# Patient Record
Sex: Female | Born: 1950 | Race: White | Hispanic: No | Smoking: Never smoker
Health system: Southern US, Community
[De-identification: ages and names within clinical notes are randomized; demographics above are authoritative.]

## PROBLEM LIST (undated history)

## (undated) HISTORY — PX: TUBAL LIGATION: SHX77

---

## 2007-02-22 ENCOUNTER — Emergency Department (HOSPITAL_COMMUNITY): Admission: EM | Admit: 2007-02-22 | Discharge: 2007-02-22 | Payer: Self-pay | Admitting: Family Medicine

## 2008-01-21 IMAGING — CR DG FOOT COMPLETE 3+V*R*
3 series · 3 of 3 positions shown · non-contrast
Comparison: None.

CLINICAL DATA: Fell 9 days ago, persistent lateral foot pain and swelling.

LEFT FOOT - 3 VIEW  02/22/2007:

[view not recorded (1 of 3)]
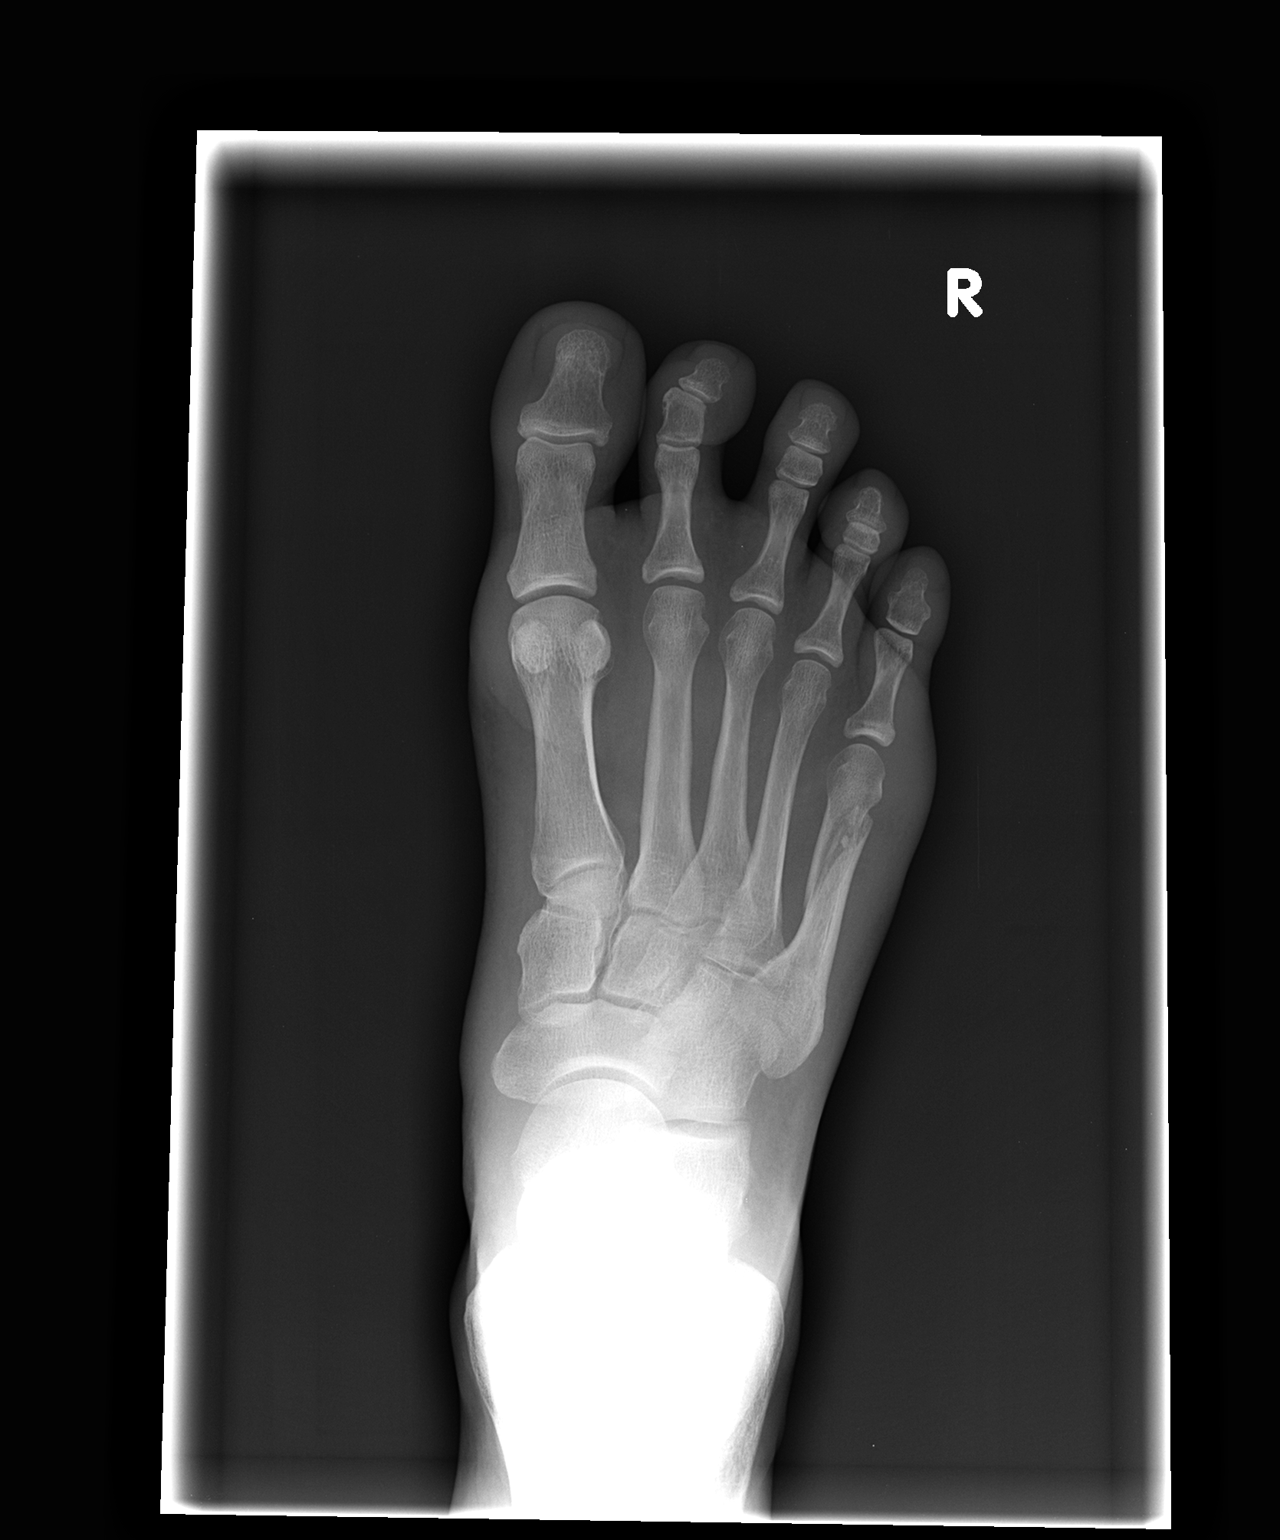

[view not recorded (2 of 3)]
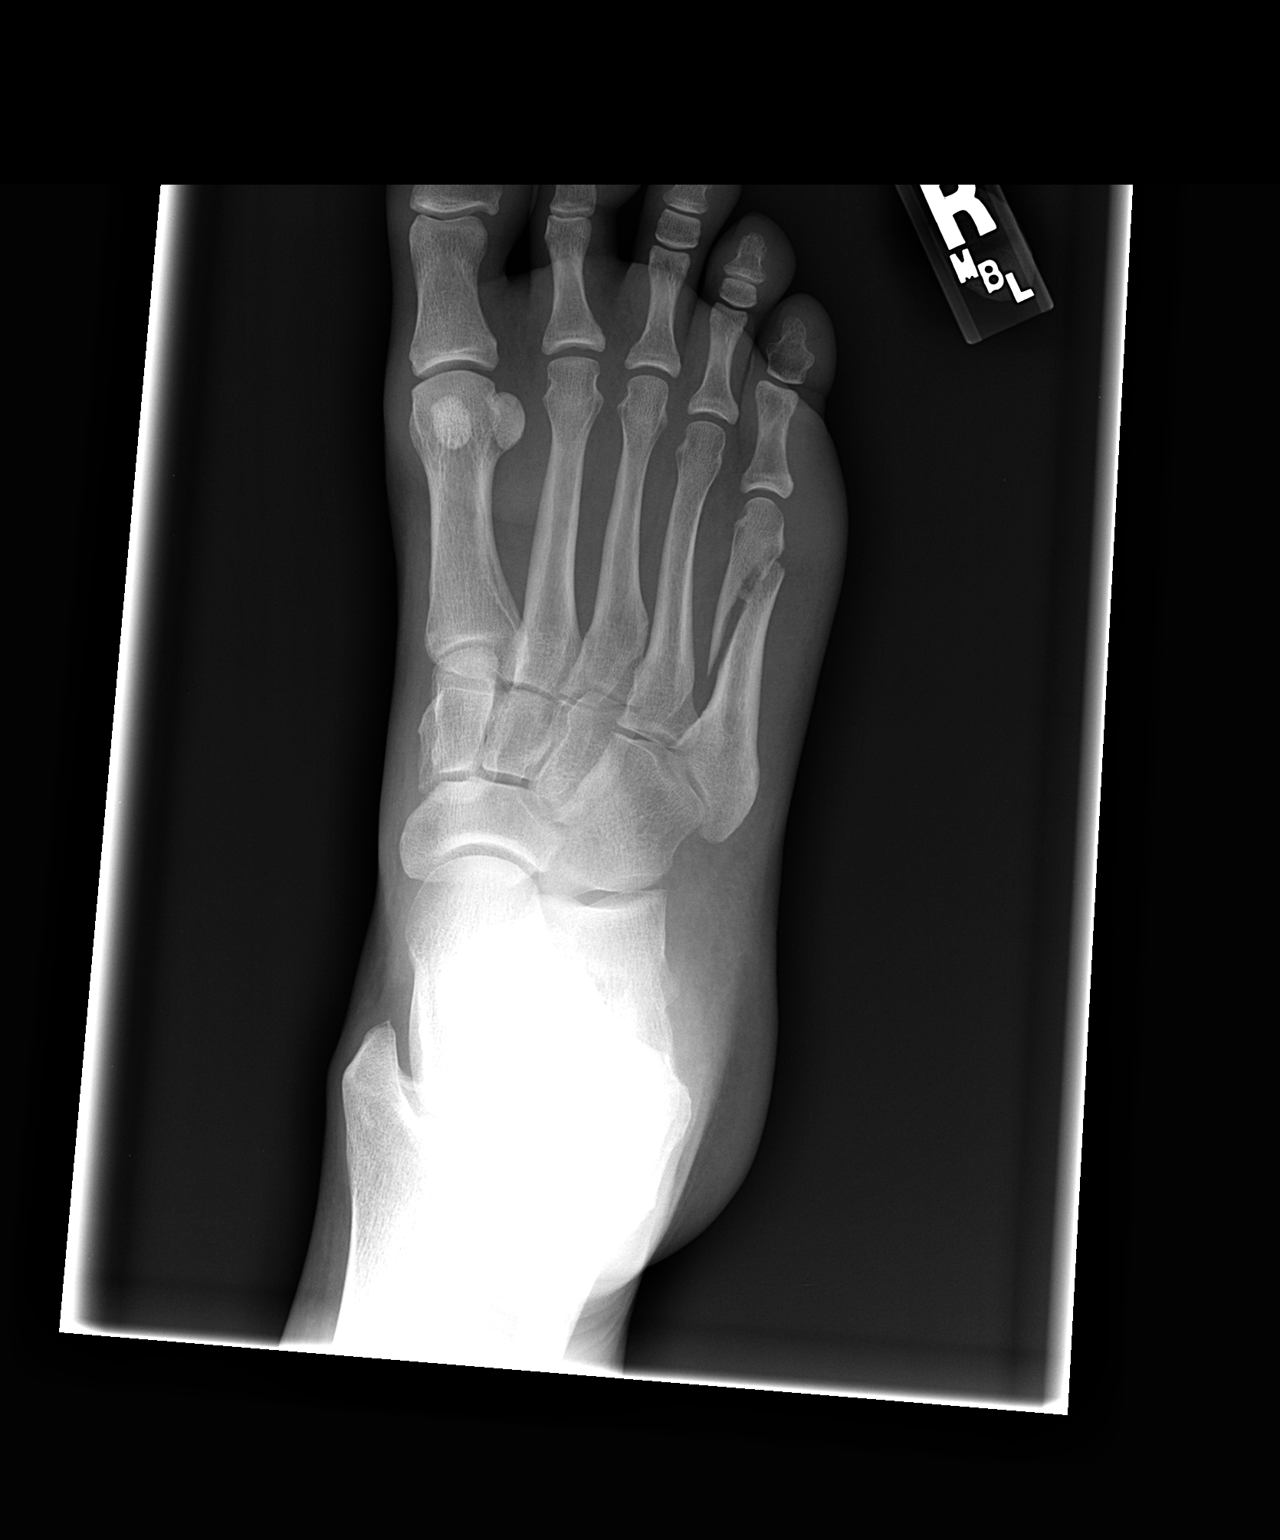

[view not recorded (3 of 3)]
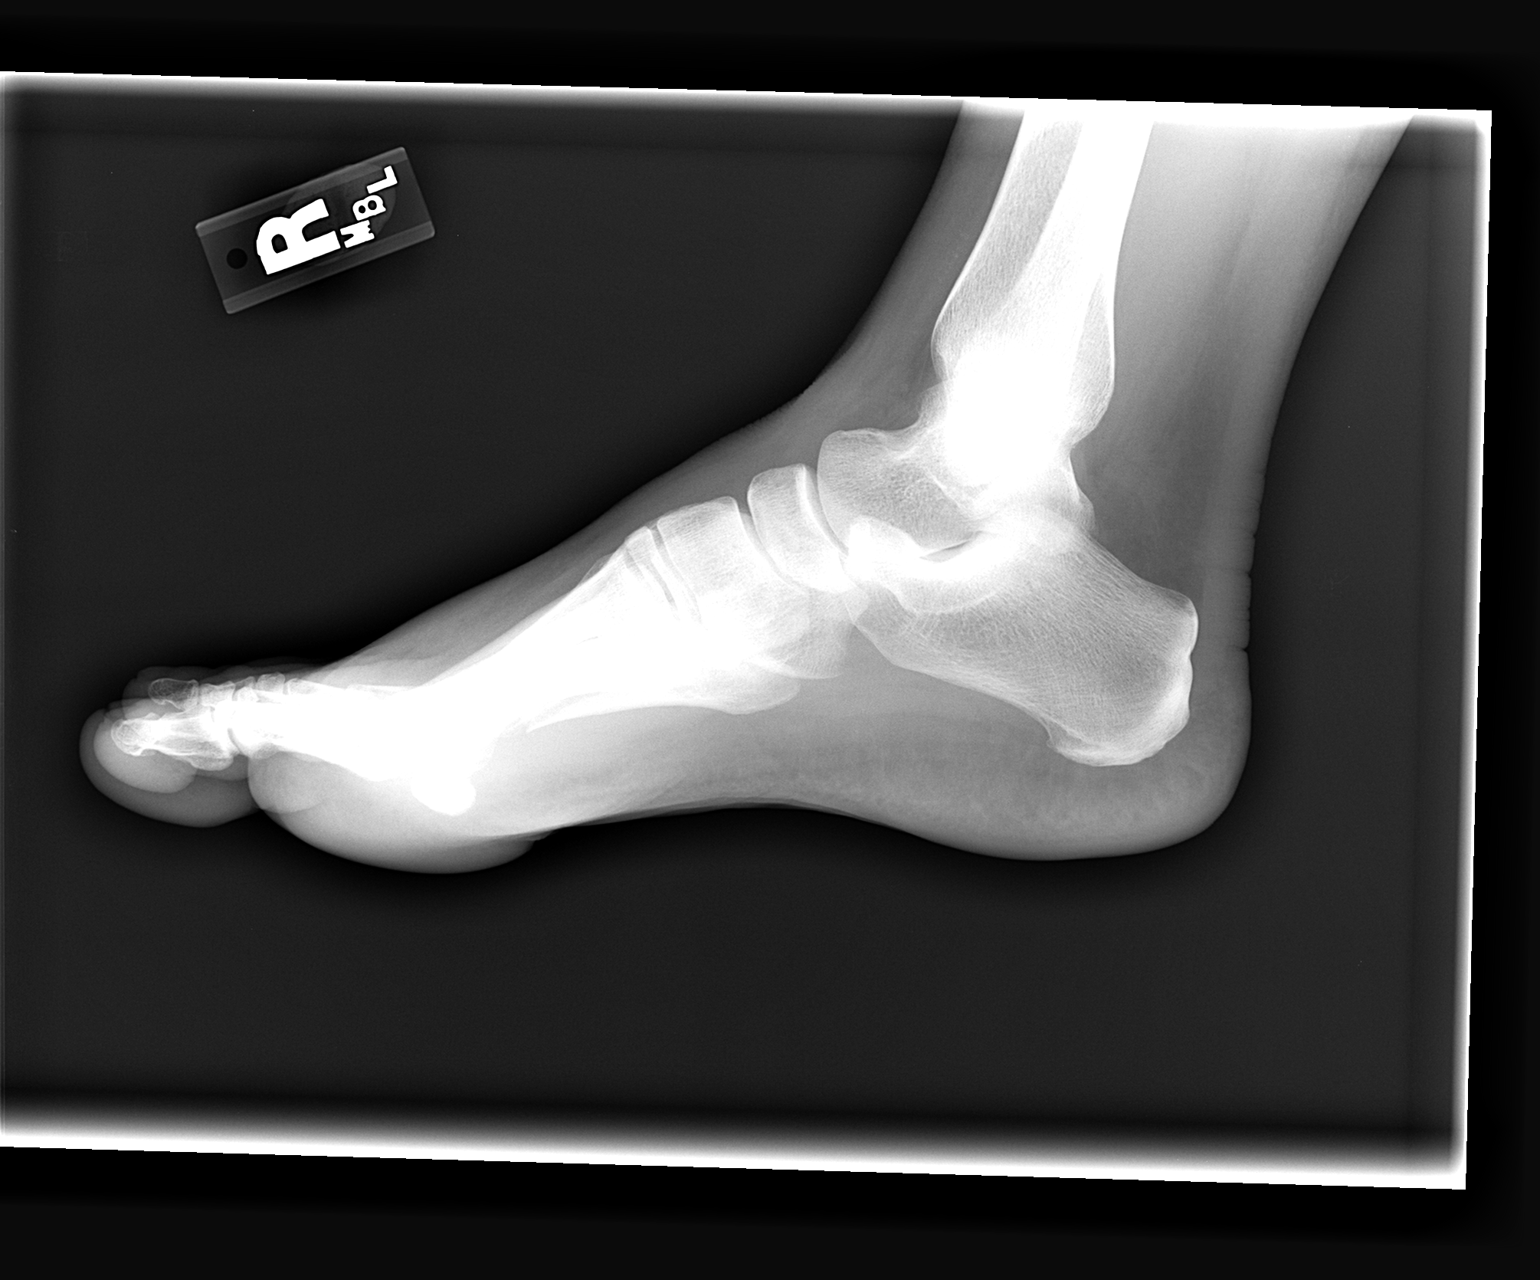

[3 of 3 positions shown; findings below may reference images not displayed]

FINDINGS: Obliquely oriented comminuted fracture through the shaft of the fifth
metatarsal. No other fractures. Joint spaces well-preserved throughout. No other
intrinsic osseous abnormalities.
IMPRESSION: Oblique fracture through the shaft of the fifth metatarsal.

## 2022-02-19 ENCOUNTER — Telehealth: Payer: Self-pay | Admitting: Family Medicine

## 2022-02-19 NOTE — Telephone Encounter (Signed)
Pt is a new pt on Thursday. She would like a call to talk about having a MWV be the code for this appt so she doesn't have to pay for this visit or the labs she wants. Her husband passed away and she is a one income. Please advise.

## 2022-02-20 NOTE — Telephone Encounter (Signed)
Spoke to patient to advised that provider will not do a NP/est care appointment and MWV together.  She wasn't happy with my answer and I also explained that we have RN do the AWV not the provider.  Her reasons are that medicare will pay for the blood work and appointment with no cost to her.      Is there any better way to advise her of this? Please advise.  Thank. Dm/cma

## 2022-02-22 ENCOUNTER — Ambulatory Visit (INDEPENDENT_AMBULATORY_CARE_PROVIDER_SITE_OTHER): Payer: Medicare Other | Admitting: Family Medicine

## 2022-02-22 ENCOUNTER — Encounter: Payer: Self-pay | Admitting: Family Medicine

## 2022-02-22 VITALS — BP 134/64 | HR 82 | Temp 97.9°F | Ht 62.0 in | Wt 106.6 lb

## 2022-02-22 DIAGNOSIS — R030 Elevated blood-pressure reading, without diagnosis of hypertension: Secondary | ICD-10-CM | POA: Diagnosis not present

## 2022-02-22 DIAGNOSIS — Q632 Ectopic kidney: Secondary | ICD-10-CM | POA: Diagnosis not present

## 2022-02-22 DIAGNOSIS — Q519 Congenital malformation of uterus and cervix, unspecified: Secondary | ICD-10-CM

## 2022-02-22 LAB — BASIC METABOLIC PANEL
BUN: 17 mg/dL (ref 6–23)
CO2: 31 mEq/L (ref 19–32)
Calcium: 9.2 mg/dL (ref 8.4–10.5)
Chloride: 105 mEq/L (ref 96–112)
Creatinine, Ser: 0.58 mg/dL (ref 0.40–1.20)
GFR: 91.38 mL/min (ref >60.00)
Glucose, Bld: 93 mg/dL (ref 70–99)
Potassium: 3.8 mEq/L (ref 3.5–5.1)
Sodium: 138 mEq/L (ref 135–145)

## 2022-02-22 NOTE — Progress Notes (Signed)
[facility] [address] [place] [address] Dept: [phone] Dept Fax: [phone]  New Patient Office Visit  Subjective:    Patient ID: Karen Donovan, female    DOB: 26-Jun-1951, 71 y.o..   MRN: [id number]  Chief Complaint  Patient presents with   Establish Care    NP- establish care.  No concerns. Fasting today.     History of Present Illness:  Patient is in today to establish care. Ms. Karen Donovan was born in [place], [place]. She attended the [employer] of [place] and has multiple Associates degrees in various aspects of [employer]	[place]. She worked for [employer] in [employer], among other insurance related jobs. She was married for 49 years before her husband died about a year ago due to drug-induced kidney failure. She admits to ongoing grief, but feels her strong [employer] faith is helping her deal with this. Ms. Karen Donovan has two children: a daughter (83) who lives in [place], and a son (70) living in [place]. She has 5 grandchildren. She is living in a Event organiser in [place]. she denies any tobacco, alcohol,. or drug use.  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     No facility-administered medications prior to visit.   No Known Allergies    Objective:   Today's Vitals   02/22/22 0841  BP: 134/64  Pulse: 82  Temp: 97.9 F (36.6 C)  TempSrc: Temporal  SpO2: 98%  Weight: 106 lb 9.6  oz (48.4 kg)  Height: 5\' 2"  (1.575 m)   Body mass index is 19.5 kg/m.   General: Well developed, well nourished. No acute distress. CV: RRR without murmurs or rubs. Pulses 2+ bilaterally. Psych: Alert and oriented. Normal mood and affect.  Health Maintenance Due  Topic Date Due   Hepatitis C Screening  Never done   TETANUS/TDAP  Never done   COLONOSCOPY (Pts 45-29yrs Insurance coverage will need to be confirmed)  Never done   MAMMOGRAM  Never done   DEXA SCAN  Never done   COVID-19 Vaccine (3 - Moderna series) 04/28/2020     Assessment & Plan:   1. Elevated blood-pressure reading without diagnosis of hypertension Ms. Karen Donovan's blood pressure is in the stage 1 hypertension range today. I recommend she monitor this at home. If her systolic persists in the 130s, she should follow up with me. I will check a BMP to assess renal function and electrolytes, esp. in light of her history of an ectopic kidney.  - Basic metabolic panel  Ms. Karen Donovan was very concerned about the cost of her care and her visit today not being done in the context of a Medicare AWV. She is also concerned about the cost of lab work, which she has been having done annually through her physician in 04/30/2020. She notes a lack of trust of nurse practitioners, fear of antibiotics, and worries over her own renal function in  light of her husband's tragic death. She refuses to consider having a mammogram, due to a prior bad experience with resulting pain from a mammogram some years ago. She is fearful of the potential for ruptured bowel during colonoscopy, so declines colon cancer screening. She also does not like the thoguht of x-ray exposure, so declines bone density scanning.  Return in about 1 year (around 02/23/2023) for Reassessment.   Loyola Mast, MD

## 2022-03-08 NOTE — Telephone Encounter (Signed)
Noted. Dm/cma  

## 2022-08-30 ENCOUNTER — Ambulatory Visit (INDEPENDENT_AMBULATORY_CARE_PROVIDER_SITE_OTHER): Payer: Medicare Other | Admitting: Family Medicine

## 2022-08-30 VITALS — BP 135/77 | HR 68 | Temp 98.3°F | Ht 62.0 in | Wt 112.3 lb

## 2022-08-30 DIAGNOSIS — N3001 Acute cystitis with hematuria: Secondary | ICD-10-CM

## 2022-08-30 DIAGNOSIS — R319 Hematuria, unspecified: Secondary | ICD-10-CM | POA: Diagnosis not present

## 2022-08-30 LAB — POCT URINALYSIS DIPSTICK
Bilirubin, UA: NEGATIVE
Glucose, UA: NEGATIVE
Ketones, UA: NEGATIVE
Nitrite, UA: NEGATIVE
Protein, UA: NEGATIVE
Spec Grav, UA: <=1.005 — AB (ref 1.010–1.025)
Urobilinogen, UA: 0.2 E.U./dL
pH, UA: 7.5 (ref 5.0–8.0)

## 2022-08-30 MED ORDER — NITROFURANTOIN MONOHYD MACRO 100 MG PO CAPS: 100.0000 mg | ORAL_CAPSULE | Freq: Two times a day (BID) | ORAL | 0 refills | Status: DC

## 2022-08-30 NOTE — Progress Notes (Signed)
OFFICE VISIT  08/30/2022  CC:  Chief Complaint  Patient presents with   Blood in urine    Symptoms started 2 days ago , describes as tingling in genital area. She did not notice blood in urine until today. She used old rx for Macrodantin 100mg  capsule prn    Patient is a 71 y.o. female who presents for bright red blood in urine.  HPI: 2 days ago patient started having a "tickle" in the urethral area, primarily when she urinates.  She does not feel any particular increase in urgency or frequency.  No burning with urination. This morning she had an episode of gross hematuria.  She denies abdominal pain, flank pain, nausea, or fever. 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 She saw a urologist at 1 point and was prescribed Macrobid to take prn intercourse. She has not taken this in several years but she had some old ones around and took 1 dose yesterday.  History reviewed. No pertinent past medical history.  Past Surgical History:  Procedure Laterality Date   CESAREAN SECTION     1979 & 1981   TUBAL LIGATION     1981    Outpatient Medications Prior to Visit  Medication Sig Dispense Refill   Multiple Vitamin (MULTIVITAMIN) tablet Take 1 tablet by mouth daily.     No facility-administered medications prior to visit.    No Known Allergies  Review of Systems  As per HPI  PE:    08/30/2022   10:25 AM 02/22/2022    8:41 AM  Vitals with BMI  Height 5\' 2"  5\' 2"   Weight 112 lbs 5 oz 106 lbs 10 oz  BMI 20.53 19.49  Systolic 135 134  Diastolic 77 64  Pulse 68 82     Physical Exam  Gen: Alert, well appearing.  Patient is oriented to person, place, time, and situation. AFFECT: Very anxious, lucid thought and speech. No further exam today.  LABS:   Last metabolic panel Lab Results  Component Value Date   GLUCOSE 93 02/22/2022   NA 138 02/22/2022   K 3.8 02/22/2022   CL 105 02/22/2022   CO2 31 02/22/2022   BUN  17 02/22/2022   CREATININE 0.58 02/22/2022   CALCIUM 9.2 02/22/2022   IMPRESSION AND PLAN:  Acute cystitis with hematuria. She brought in a urine sample from home this morning and it appears grossly bloody, UA showing 3+ blood and 3+ leukocytes. A fresh urine specimen in the office today is clear and yellow and dipstick UA shows 3+ blood and 1+ leukocytes. Her urine will be sent for culture and sensitivity. She is very afraid of antibiotics because apparently her husband died earlier this year from kidney failure.  He had been treated with several antibiotics for urinary infection.  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   She has confidence that she will do okay with Macrobid so I did prescribe Macrobid 100 mg to take twice daily x 5 days. Encouraged her to follow-up with her primary care physician.  An After Visit Summary was printed and given to the patient.  FOLLOW UP: Return if symptoms worsen or fail to improve.  Signed:  02/24/2022, MD           08/30/2022

## 2022-08-31 LAB — URINE CULTURE
MICRO NUMBER:: 14365583
Result:: NO GROWTH
SPECIMEN QUALITY:: ADEQUATE

## 2022-09-06 ENCOUNTER — Telehealth: Payer: Self-pay | Admitting: Family Medicine

## 2022-09-06 DIAGNOSIS — R310 Gross hematuria: Secondary | ICD-10-CM

## 2022-09-06 DIAGNOSIS — R3915 Urgency of urination: Secondary | ICD-10-CM

## 2022-09-06 MED ORDER — NITROFURANTOIN MONOHYD MACRO 100 MG PO CAPS: 100.0000 mg | ORAL_CAPSULE | Freq: Two times a day (BID) | ORAL | 0 refills | Status: DC

## 2022-09-06 NOTE — Telephone Encounter (Signed)
Pt has called back a couple different times. She continues to call about being referred to a urologist, and now has a name of a preferred Dr. Unless Dr. Anitra Lauth knows someone in the [place] area. She was referred to the name Dr. Raynelle Bring located on [address].

## 2022-09-06 NOTE — Telephone Encounter (Signed)
Pt saw Dr Anitra Lauth on 08/30/22 for Acute cystitis with hematuria +1 more Dx. In the AVS he says to call back if blood in her urine continues and he will place a referral. It has happened again, she has blood In her urine, burning and urgency. She is wanting to come by and get a urine container from our office. Please advise pt at [phone].

## 2022-09-06 NOTE — Telephone Encounter (Signed)
Pt is wanting to know Dr Rudd's thought process in him giving her a specimen cup. I let her know several times she requested the cup. Pt would like a cb.

## 2022-09-06 NOTE — Telephone Encounter (Signed)
Okay, Macrobid refill sent in. Urology referral completed.

## 2022-09-06 NOTE — Telephone Encounter (Signed)
She took her last one, 01/01 or 01/02. She did not have any symptoms during time of taking medication. Current symptoms: burning, tingling, urgency to go as returned, hematuria was present at 6am. She drank plenty of water and blood was not present this last time went.

## 2022-09-06 NOTE — Telephone Encounter (Signed)
Spoke to patient and advised that Dr Lum Babe office called in a prescription and placed a referral .   She already picked up the urine cup.  Dm/cma

## 2022-09-06 NOTE — Telephone Encounter (Signed)
Pt is very concerned and ask that Dr. Anitra Lauth refill the [facility] because that helped with her symptoms and made them go away. She also request that she get referred to a Urologist not an NP and she would like one that has years of experience and is older. Please give the patient a call.

## 2022-09-06 NOTE — Telephone Encounter (Signed)
Referral pending, if approved please sign

## 2022-09-06 NOTE — Telephone Encounter (Signed)
Pt advised referral sent and refill of medication

## 2022-09-06 NOTE — Telephone Encounter (Signed)
Pt would like you to know she has fasted in case you need to test her creatine.

## 2022-12-24 ENCOUNTER — Encounter: Payer: Self-pay | Admitting: Nurse Practitioner

## 2022-12-24 ENCOUNTER — Ambulatory Visit (INDEPENDENT_AMBULATORY_CARE_PROVIDER_SITE_OTHER): Payer: Medicare Other | Admitting: Nurse Practitioner

## 2022-12-24 VITALS — BP 121/80 | HR 64 | Temp 98.4°F | Resp 16 | Ht 62.0 in | Wt 110.0 lb

## 2022-12-24 DIAGNOSIS — S134XXA Sprain of ligaments of cervical spine, initial encounter: Secondary | ICD-10-CM

## 2022-12-24 MED ORDER — IBUPROFEN 200 MG PO TABS: 400.0000 mg | ORAL_TABLET | Freq: Three times a day (TID) | ORAL | 0 refills | Status: DC | PRN

## 2022-12-24 NOTE — Patient Instructions (Signed)
Use ibuprofen 200-400mg  every 8hrs as needed for pain, take with food Use warm compress 3x/day, each time before neck exercise Then use cold compress after neck exercise. Perform neck exercise once a day.  Cervical Sprain A cervical sprain is a stretch or tear in one or more of the ligaments in the neck. Ligaments are the tissues that connect bones to each other. Cervical sprains can range from mild to severe. Severe cervical sprains can cause the spinal bones (vertebrae) in the neck to be unstable. This can result in spinal cord damage and serious nervous system problems. Healing time for a cervical sprain depends on the cause and extent of the injury. Most cervical sprains heal in 4-6 weeks. What are the causes? Cervical sprains may be caused by trauma, such as an injury from a motor vehicle accident, a fall, or a sudden forward and backward whipping movement of the head and neck (whiplash injury). Mild cervical sprains may be caused by wear and tear over time. What increases the risk? You are more likely to get a cervical sprain if: You take part in activities that have a high risk of trauma to the neck. These include contact sports, gymnastics, and diving. You have: Osteoarthritis of the spine. Poor strength and flexibility of the neck. Poor posture. You have had a neck injury in the past. You spend long periods in positions that put stress on the neck, such as sitting at a computer. What are the signs or symptoms? Symptoms of this condition include: Any of these problems in the neck, shoulders, or upper back: Pain or tenderness. Stiffness. Swelling. A burning feeling. Sudden tightening of neck muscles (spasms). Limited ability to move the neck. Headache. Dizziness. Nausea or vomiting. Weakness, numbness, or tingling in a hand or an arm. Symptoms may develop right away after injury or may develop over a few days. In some cases, symptoms may go away with treatment and return  (recur) over time. How is this diagnosed? This condition may be diagnosed based on: Your symptoms, medical history, and a physical exam. Any recent injuries or known neck problems that you have, such as arthritis in the neck. Imaging tests, such as X-rays, an MRI, or a CT scan. How is this treated? This condition is treated by resting and icing the injured area and doing physical therapy exercises to improve movement and strength. Heat therapy may be used 2-3 days after the injury if there is no swelling. Depending on the severity of your condition, treatment may also include: Keeping your neck in place (immobilized) for periods of time. This may be done using: A cervical collar. This supports your chin and the back of your head. A cervical traction device. This is a sling that holds up your head. It removes weight and pressure from your neck. Medicines for pain or other symptoms. Surgery. This is rare. Follow these instructions at home: Medicines Take over-the-counter and prescription medicines only as told by your health care provider. Ask your provider if the medicine prescribed to you: Requires you to avoid driving or using machinery. Can cause constipation. You may need to take these actions to prevent or treat constipation: Drink enough fluid to keep your pee pale yellow. Take over-the-counter or prescription medicines. Eat foods that are high in fiber, such as beans, whole grains, and fresh fruits and vegetables. Limit foods that are high in fat and processed sugars, such as fried or sweet foods. If you have a cervical collar: Wear the collar as  told by your provider. Do not remove it unless told. Ask before making any adjustments to your collar. If you have long hair, keep it outside of the collar. If you are allowed to remove the collar for cleaning and bathing: Follow instructions about how to remove it safely. Clean it by hand with mild soap and water and air-dry it  completely. If your collar has removable pads, remove them every 1-2 days and wash them by hand with soap and water. Let them air-dry completely before putting them back in the collar. Tell your provider if your skin under the collar has irritation or sores. Managing pain, stiffness, and swelling     Use a cervical traction device as told. If told, put ice on the affected area. Put ice in a plastic bag. Place a towel between your skin and the bag. Leave the ice on for 20 minutes, 2-3 times a day. If told, apply heat to the affected area before you exercise or as often as told by your provider. Use the heat source that your provider recommends, such as a moist heat pack or a heating pad. Place a towel between your skin and the heat source. Leave the heat on for 20-30 minutes. If your skin turns bright red, remove the ice or heat right away to prevent skin damage. The risk of damage is higher if you cannot feel pain, heat, or cold. Activity Do not drive while wearing a cervical collar. If you do not have a cervical collar, ask if it is safe to drive while your neck heals. Do not lift anything that is heavier than 10 lb (4.5 kg) until your provider says that it is safe. Rest as told by your provider. Avoid positions and activities that make your symptoms worse. Do physical therapy exercises as told by your provider or physical therapist. Return to your normal activities as told by your provider. Ask your provider what activities are safe for you. General instructions Do not use any products that contain nicotine or tobacco. These products include cigarettes, chewing tobacco, and vaping devices, such as e-cigarettes. These can delay healing. If you need help quitting, ask your provider. Keep all follow-up visits. Your provider will monitor your injury and activity level. How is this prevented? To prevent a cervical sprain from happening again: Use and maintain good posture. Make any needed  adjustments to your workstation to help you do this. Exercise regularly as told by your provider or physical therapist. Avoid risky activities that may cause a cervical sprain. Contact a health care provider if: You have symptoms that get worse or do not get better after 2 weeks of treatment. You have new symptoms. Your pain gets worse or does not get better with medicine. You have sores or irritated skin on your neck from wearing your cervical collar. Get help right away if: You have severe pain. You develop numbness, tingling, or weakness in any part of your body. You cannot move a part of your body (you have paralysis). You have neck pain along with severe dizziness or headache. This information is not intended to replace advice given to you by your health care provider. Make sure you discuss any questions you have with your health care provider. Document Revised: 03/23/2022 Document Reviewed: 03/23/2022 Elsevier Patient Education  2023 [employer]  Cervical Strain and Sprain Rehab Ask your health care provider which exercises are safe for you. Do exercises exactly as told by your health care provider and adjust them as  directed. It is normal to feel mild stretching, pulling, tightness, or discomfort as you do these exercises. Stop right away if you feel sudden pain or your pain gets worse. Do not begin these exercises until told by your health care provider. Stretching and range-of-motion exercises Cervical side bending  Using good posture, sit on a stable chair or stand up. Without moving your shoulders, slowly tilt your left / right ear to your shoulder until you feel a stretch in the neck muscles on the opposite side. You should be looking straight ahead. Hold for __________ seconds. Repeat with the other side of your neck. Repeat __________ times. Complete this exercise __________ times a day. Cervical rotation  Using good posture, sit on a stable chair or stand up. Slowly  turn your head to the side as if you are looking over your left / right shoulder. Keep your eyes level with the ground. Stop when you feel a stretch along the side and the back of your neck. Hold for __________ seconds. Repeat this by turning to your other side. Repeat __________ times. Complete this exercise __________ times a day. Thoracic extension and pectoral stretch  Roll a towel or a small blanket so it is about 4 inches (10 cm) in diameter. Lie down on your back on a firm surface. Put the towel in the middle of your back across your spine. It should not be under your shoulder blades. Put your hands behind your head and let your elbows fall out to your sides. Hold for __________ seconds. Repeat __________ times. Complete this exercise __________ times a day. Strengthening exercises Upper cervical flexion  Lie on your back with a thin pillow behind your head or a small, rolled-up towel under your neck. Gently tuck your chin toward your chest and nod your head down to look toward your feet. Do not lift your head off the pillow. Hold for __________ seconds. Release the tension slowly. Relax your neck muscles completely before you repeat this exercise. Repeat __________ times. Complete this exercise __________ times a day. Cervical extension  Stand about 6 inches (15 cm) away from a wall, with your back facing the wall. Place a soft object, about 6-8 inches (15-20 cm) in diameter, between the back of your head and the wall. A soft object could be a small pillow, a ball, or a folded towel. Gently tilt your head back and press into the soft object. Keep your jaw and forehead relaxed. Hold for __________ seconds. Release the tension slowly. Relax your neck muscles completely before you repeat this exercise. Repeat __________ times. Complete this exercise __________ times a day. Posture and body mechanics Body mechanics refer to the movements and positions of your body while you do your  daily activities. Posture is part of body mechanics. Good posture and healthy body mechanics can help to relieve stress in your body's tissues and joints. Good posture means that your spine is in its natural S-curve position (your spine is neutral), your shoulders are pulled back slightly, and your head is not tipped forward. The following are general guidelines for using improved posture and body mechanics in your everyday activities. Sitting  When sitting, keep your spine neutral and keep your feet flat on the floor. Use a footrest, if needed, and keep your thighs parallel to the floor. Avoid rounding your shoulders. Avoid tilting your head forward. When working at a desk or a computer, keep your desk at a height where your hands are slightly lower than your  elbows. Slide your chair under your desk so you are close enough to maintain good posture. When working at a computer, place your monitor at a height where you are looking straight ahead and you do not have to tilt your head forward or downward to look at the screen. Standing  When standing, keep your spine neutral and keep your feet about hip-width apart. Keep a slight bend in your knees. Your ears, shoulders, and hips should line up. When you do a task in which you stand in one place for a long time, place one foot up on a stable object that is 2-4 inches (5-10 cm) high, such as a footstool. This helps keep your spine neutral. Resting When lying down and resting, avoid positions that are most painful for you. Try to support your neck in a neutral position. You can use a contour pillow or a small rolled-up towel. Your pillow should support your neck but not push on it. This information is not intended to replace advice given to you by your health care provider. Make sure you discuss any questions you have with your health care provider. Document Revised: 03/12/2022 Document Reviewed: 03/12/2022 Elsevier Patient Education  2023 [employer].
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
## 2023-04-22 ENCOUNTER — Encounter: Payer: Medicare Other | Admitting: Family Medicine

## 2023-11-26 ENCOUNTER — Encounter: Payer: Self-pay | Admitting: Internal Medicine

## 2023-11-26 ENCOUNTER — Ambulatory Visit: Payer: Self-pay | Admitting: *Deleted

## 2023-11-26 ENCOUNTER — Ambulatory Visit (INDEPENDENT_AMBULATORY_CARE_PROVIDER_SITE_OTHER): Admitting: Internal Medicine

## 2023-11-26 VITALS — BP 130/68 | HR 87 | Temp 98.9°F | Ht 62.0 in | Wt 113.8 lb

## 2023-11-26 DIAGNOSIS — T7840XA Allergy, unspecified, initial encounter: Secondary | ICD-10-CM | POA: Diagnosis not present

## 2023-11-26 MED ORDER — METHYLPREDNISOLONE 4 MG PO TBPK: ORAL_TABLET | ORAL | 0 refills | Status: DC

## 2023-11-26 MED ORDER — METHYLPREDNISOLONE SODIUM SUCC 125 MG IJ SOLR
125.0000 mg | Freq: Once | INTRAMUSCULAR | Status: AC
  Administered 2023-11-26: 125 mg via INTRAMUSCULAR

## 2023-11-26 MED ORDER — FAMOTIDINE 20 MG PO TABS: 20.0000 mg | ORAL_TABLET | Freq: Two times a day (BID) | ORAL | 0 refills | Status: DC

## 2023-11-26 NOTE — Telephone Encounter (Addendum)
 Copied from CRM [id number]. Topic: Clinical - Red Word Triage >> Nov 26, 2023  1:26 PM Elizebeth Brooking wrote: [facility] that prompted transfer to Nurse Triage: Patient called in stating she is having a allergic reaction to fresh fruit, stated she woke up last night choking because her tongue was so swollen, stated she wants to see Dr.Rudd tomorrow at o'clock pm , did not schedule until they spoke with nurses first Reason for Disposition  SEVERE swelling of the entire face    Allergic reaction to oranges she ate on Sunday morning.  Facial swelling, tongue swelling.   She woke up choking during the night due to the swelling in her tongue.  Answer Assessment - Initial Assessment Questions 1. SYMPTOMS: "What is the most serious symptom?"     I ate oranges on Sunday morning.   Having an allergic reaction.   I know better but I was in a hurry.  I just needed to eat something real quick.   My mouth was breaking out in sores and hurting on Sunday night.  Woke up Monday the right side of my face was swollen and hurting and my chin was swollen too.     Monday night I had cold compresses on it due to the pain.   I took a Tylenol which I never do because I don't believe in pain killers.    Last night I woke up choking due to my tongue being so swollen.   It's swollen now.   I took another Benadryl just now.   I just want to come in for that 1:00 apt tomorrow with Dr. Veto Kemps.   "The lady I just talked to said he has an appt tomorrow at 1:00 but she said I had to talk to the nurse first".   "I just need you to get that appt before it's gone".   "That 1:00 appt tomorrow".    "I really need to get back to my gardening just please make that appt for tomorrow".    Pt was insistant on me making the appt.   I let her know she needed to go to the ED or at least be seen today due to the swelling in her tongue and the reaction she was having.    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   I'm ok.    I can still  breath.  I just need to come in tomorrow at that 1:00 appt.  2. AIRWAY: "Are they breathing?" (e.g., Yes, No)  (R/O: airway blockage, respiratory arrest)      She woke up last night choking because her tongue is so swollen.   She said the swelling is better today and she's not having difficulty breathing.    3. BREATHING: "Is there difficulty breathing?" (e.g., Yes, No; wheezing, unable to complete a sentence)  (R/O: respiratory distress)     No 4. CIRCULATION: "Are you feeling weak?"  (e.g., Yes, No, Unknown; severity)  (R/O: shock) If Yes, ask: "Can you stand and walk normally?"     No    I feel fine.   "I just want that 1:00 appt tomorrow with Dr. Veto Kemps before it gets gone".   "The lady I was talking to before you (agent) said he had that open. 5. SWALLOWING: "Can you swallow?" (e.g.,Yes, No; food, fluid, saliva)      Yes  I'm doing fine.   The swelling is much better today.  I knew better than to eat the fruit. 6. ONSET: "When did the reaction start?" (Minutes or hours ago)      Sunday morning after eating oranges 7. SUBSTANCE: "What are you reacting to?" "When did the contact occur?"      Oranges 8. PREVIOUS REACTION: "Have you ever reacted to it before?" If Yes, ask: "What happened that time?"     Yes  I swelled up like this.   I just waited it out.   I was fine.   I'm having the same thing happen this time.   I knew better than to eat the fruit but I was in a hurry and needed to eat something. 9. EPINEPHRINE: "Do you have an epinephrine autoinjector (e.g., EpiPen)?"     No  Protocols used: Anaphylaxis-A-AH, Face Swelling-A-AH  Chief Complaint: Having an allergic reaction to some oranges she ate on Sunday morning. Symptoms: Sunday night her mouth started breaking out in sores and hurting.  She had swelling of the right side of her chin and face along with pain.  Last night she woke up choking because her tongue was so swollen.  She took some Benadryl and "I'm doing much better now".    Frequency: Started Sunday night Pertinent Negatives: Patient denies shortness of breath, or chest tightness or difficulty breathing.   Denies wheezing. Disposition: [] ED /[] Urgent Care (no appt availability in office) / [x] Appointment(In office/virtual)/ []  [place] [facility]/ [] Home Care/ [] Refused Recommended Disposition /[] [facility] [facility]/ []  Follow-up with PCP Additional Notes: See triage notes for details.   When agent transferred her to me for triage she kept insisting she needed that 1:00 appt tomorrow with Dr. Veto Kemps.   It was difficult to triage her and get her to answer questions as she kept insisting she wanted that 1:00 appt with Dr. Veto Kemps tomorrow before someone else got it.   I let her know at first she needed to go to the ED however after talking further with her and getting more answers to the triage questions it was determined she didn't need to go to the ED at this point but still needed to be seen today preferably.  She is taking Benadryl and she stated the swelling is much better now.   Her speech was clear and appropriate and she denied any breathing difficulties but her tongue was not as swollen.    She was very insistent on getting that 1:00 appt.   She finally got short with me and said,   "I just need to get back out to my gardening just make me that 1:00 appt with Dr. Veto Kemps for tomorrow".    I made her the appt but I did go over the signs/symptoms she needed to know to go to the ED:  shortness of breath, increased swelling of her tongue, lips and throat and her face, wheezing, chest tightness or breaking out in hives.    She responded,   "I am fine I don't need to go to the emergency room or anything".    "Did you make that appt for 1:00 tomorrow so I can get back to my gardening?"  I let her know I did make the appt.   She thanked me and hung up.     I called into the practice and spoke with Myrene Buddy.   She is going to get this information to Dr. Veto Kemps and let him make the  decision as to whether she should go on to the ED since  she became short with me during the triage.

## 2023-11-26 NOTE — Telephone Encounter (Signed)
 Spoke to patient and she declines to go to the Urgent care.  I was able to get her in today with Morrie Sheldon at 3:40 pm.    Patient has been worked up.  Dm/cma

## 2023-11-26 NOTE — Patient Instructions (Signed)
 Continue benadryl 25mg  ever 4-6 hours as needed for swelling.  Take medications as prescribed.   Need to schedule a routine check up with Dr. Veto Kemps in the next couple months for health maintenance.

## 2023-11-26 NOTE — Telephone Encounter (Signed)
 Angelique Blonder noticed this pt was scheduled for tomorrow at 1:00. Pending symptoms, she alerted me. Attempted to call pt to advise her to go to [facility] or ED pending symptoms. Pt scheduled at 3:40 this afternoon.

## 2023-11-26 NOTE — Progress Notes (Signed)
 [facility] [address] [place] [address] Dept: [phone] Dept Fax: [phone]  Acute Care Office Visit  Subjective:   Karen Donovan 07-31-1951 11/26/2023  Chief Complaint  Patient presents with   Allergic Reaction    C/o having facial swelling/tongue x 2 days.  Has been taking Benadryl.   (Fresh fruit)    HPI: Discussed the use of AI scribe software for clinical note transcription with the patient, who gave verbal consent to proceed.  History of Present Illness   The patient presents with an allergic reaction, which she attributes to consuming two bowls of mixed fruit, primarily oranges 2 days ago. The reaction began with facial swelling and mouth pain within an hour of consumption. Despite attempts at home remedies, the swelling persisted and worsened, leading to significant tongue swelling and a choking incident while laying in bed. The patient has a history of similar reactions to fresh fruit, which were previously managed with prednisone. She has been self-medicating with Benadryl every four hours, which has provided minimal relief and caused fatigue. Denies SHOB, wheezing.         The following portions of the patient's history were reviewed and updated as appropriate: past medical history, past surgical history, family history, social history, allergies, medications, and problem list.   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 No pertinent past medical history. Past Surgical History:  Procedure Laterality Date   CESAREAN SECTION     1979 & 1981   TUBAL LIGATION     1981   Family History  Problem Relation Age of Onset   Heart disease Mother    Emphysema Father     Current Outpatient Medications:    Biotin 10 MG CAPS, Take by mouth., Disp: , Rfl:    famotidine (PEPCID) 20  MG tablet, Take 1 tablet (20 mg total) by mouth 2 (two) times daily for 10 days., Disp: 20 tablet, Rfl: 0   methylPREDNISolone (MEDROL DOSEPAK) 4 MG TBPK tablet, Use as directed., Disp: 21 each, Rfl: 0   Multiple Vitamin (MULTIVITAMIN) tablet, Take 1 tablet by mouth daily., Disp: , Rfl:  No Known Allergies   ROS: A complete ROS was performed with pertinent positives/negatives noted in the HPI. The remainder of the ROS are negative.    Objective:   Today's Vitals   11/26/23 1543  BP: 130/68  Pulse: 87  Temp: 98.9 F (37.2 C)  TempSrc: Temporal  SpO2: 99%  Weight: 113 lb 12.8 oz (51.6 kg)  Height: 5\' 2"  (1.575 m)    GENERAL: Well-appearing, in NAD. Well nourished. Able to speak in complete sentences.  SKIN: Pink, warm and dry. No rash.  HEENT:    HEAD: Normocephalic, non-traumatic.  EYES: Conjunctive pink without exudate. PERRL.  EARS: External ear w/o redness, swelling, masses, or lesions. EAC clear. TM's intact, translucent w/o bulging, appropriate landmarks visualized.  NOSE: Septum midline w/o deformity. Nares patent, mucosa pink and non-inflamed w/o drainage.  THROAT: Uvula midline. Oropharynx clear. Tonsils non-inflamed w/o exudate . Mucus membranes pink and moist. Tongue with mild diffuse swelling. Mild-moderate facial swelling to chin and cheeks.  NECK: Trachea midline. Full ROM w/o pain or tenderness. No lymphadenopathy.  RESPIRATORY: Chest wall symmetrical. Respirations even and non-labored. Breath sounds clear to auscultation bilaterally. No stridor or wheezing.  CARDIAC: S1, S2 present, regular rate and rhythm. Peripheral pulses  2+ bilaterally.  EXTREMITIES: Without clubbing, cyanosis, or edema.  NEUROLOGIC: Steady, even gait.  PSYCH/MENTAL STATUS: Alert, oriented x 3. Cooperative, appropriate mood and affect.    No results found for any visits on 11/26/23.    Assessment & Plan:  Assessment and Plan    Allergic Reaction Acute allergic reaction likely due to fresh  fruit, specifically oranges, causing significant tongue swelling and difficulty swallowing.  - Administer Solumedrol 125mg  IM today. - Prescribe oral steroid pack to start tomorrow. - Continue Benadryl as needed. - Prescribe Pepcid for histamine response. - Advise avoidance of fresh fruits, especially oranges. - Recommend emergency care if airway difficulty or worsening symptoms occur. - Consider future allergy testing.  General Health Maintenance She has not had a routine visit since 2023. Encouraged to maintain annual check-ups. - Schedule routine follow-up visit in the next couple of months. - Encourage annual check-ups.      Meds ordered this encounter  Medications   methylPREDNISolone sodium succinate (SOLU-MEDROL) 125 mg/2 mL injection 125 mg   methylPREDNISolone (MEDROL DOSEPAK) 4 MG TBPK tablet    Sig: Use as directed.    Dispense:  21 each    Refill:  0    Supervising Provider:   Garnette Gunner [[id number]]   famotidine (PEPCID) 20 MG tablet    Sig: Take 1 tablet (20 mg total) by mouth 2 (two) times daily for 10 days.    Dispense:  20 tablet    Refill:  0    Supervising Provider:   Garnette Gunner [[id number]]   No orders of the defined types were placed in this encounter.  Lab Orders  No laboratory test(s) ordered today   No images are attached to the encounter or orders placed in the encounter.  Return if symptoms worsen or fail to improve.   Salvatore Decent, FNP

## 2023-11-27 ENCOUNTER — Ambulatory Visit: Admitting: Family Medicine

## 2023-12-02 ENCOUNTER — Telehealth: Payer: Self-pay | Admitting: Family Medicine

## 2023-12-02 ENCOUNTER — Ambulatory Visit (INDEPENDENT_AMBULATORY_CARE_PROVIDER_SITE_OTHER): Admitting: Internal Medicine

## 2023-12-02 ENCOUNTER — Encounter: Payer: Self-pay | Admitting: Internal Medicine

## 2023-12-02 VITALS — BP 120/74 | HR 73 | Temp 97.7°F | Ht 62.0 in | Wt 110.2 lb

## 2023-12-02 DIAGNOSIS — K120 Recurrent oral aphthae: Secondary | ICD-10-CM

## 2023-12-02 DIAGNOSIS — K05219 Aggressive periodontitis, localized, unspecified severity: Secondary | ICD-10-CM | POA: Diagnosis not present

## 2023-12-02 MED ORDER — AMOXICILLIN-POT CLAVULANATE 875-125 MG PO TABS: 1.0000 | ORAL_TABLET | Freq: Two times a day (BID) | ORAL | 0 refills | Status: AC

## 2023-12-02 NOTE — Progress Notes (Signed)
 [facility] [address] [place] [address] Dept: [phone] Dept Fax: [phone]  Acute Care Office Visit  Subjective:   Karen Donovan 02-Jan-1951 12/02/2023  Chief Complaint  Patient presents with   Follow-up    HPI: Discussed the use of AI scribe software for clinical note transcription with the patient, who gave verbal consent to proceed.  History of Present Illness   The patient, with a recent history of an allergic reaction, presents with oral discomfort and swelling onset last week. She reports that prednisone has been effective in reducing the tongue and facial swelling for the recent allergic reaction. She attempted to use an electric toothbrush and gum massager this past week, which initially felt good but subsequently irritated her lower gum line and caused swelling. The swelling was not painful but formed a large "lump" in her left lower gumline. After drinking lots of water, the "lump" burst, releasing pus and a small amount of blood. Since this event, the patient reports feeling much better. However, she has noticed a new lump in the chin area, which she believes is related to the initial swelling. She also reports a swollen tongue, which she believes looks much better than before. The patient is on her last day of prednisone and has four more days of Pepcid. She has been avoiding fresh fruit due to the allergic reaction.       The following portions of the patient's history were reviewed and updated as appropriate: past medical history, past surgical history, family history, social history, allergies, medications, and problem list.   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 No pertinent past medical history. Past Surgical History:  Procedure Laterality Date   CESAREAN SECTION      1979 & 1981   TUBAL LIGATION     1981   Family History  Problem Relation Age of Onset   Heart disease Mother    Emphysema Father     Current Outpatient Medications:    amoxicillin-clavulanate (AUGMENTIN) 875-125 MG tablet, Take 1 tablet by mouth 2 (two) times daily for 7 days., Disp: 14 tablet, Rfl: 0   Biotin 10 MG CAPS, Take by mouth., Disp: , Rfl:    famotidine (PEPCID) 20 MG tablet, Take 1 tablet (20 mg total) by mouth 2 (two) times daily for 10 days., Disp: 20 tablet, Rfl: 0   methylPREDNISolone (MEDROL DOSEPAK) 4 MG TBPK tablet, Use as directed., Disp: 21 each, Rfl: 0   Multiple Vitamin (MULTIVITAMIN) tablet, Take 1 tablet by mouth daily., Disp: , Rfl:  No Known Allergies   ROS: A complete ROS was performed with pertinent positives/negatives noted in the HPI. The remainder of the ROS are negative.    Objective:   Today's Vitals   12/02/23 1020  BP: 120/74  Pulse: 73  Temp: 97.7 F (36.5 C)  TempSrc: Temporal  SpO2: 99%  Weight: 110 lb 3.2 oz (50 kg)  Height: 5\' 2"  (1.575 m)    GENERAL: Well-appearing, in NAD. Well nourished.  SKIN: Pink, warm and dry. No rash, lesion, ulceration, or ecchymoses.  HEENT:    HEAD: Normocephalic, non-traumatic.  THROAT: Uvula midline. Oropharynx clear. Mucus membranes pink and moist.  < 5cm Ulceration with white discharge present to right lower gum line. No tongue swelling.  RESPIRATORY: Chest wall symmetrical. Respirations even and non-labored.  PSYCH/MENTAL STATUS: Alert, oriented  x 3. Cooperative, appropriate mood and affect.    No results found for any visits on 12/02/23.    Assessment & Plan:  Assessment and Plan    Canker sore with secondary gum infection - Prescribe Augmentin (amoxicillin/clavulanate) 1 tablet twice a day for 7 days - Recommend over-the-counter Orajel for analgesia if needed       Meds ordered this encounter  Medications   amoxicillin-clavulanate (AUGMENTIN) 875-125 MG tablet    Sig: Take 1 tablet by  mouth 2 (two) times daily for 7 days.    Dispense:  14 tablet    Refill:  0    Supervising Provider:   Garnette Gunner [[id number]]   No orders of the defined types were placed in this encounter.  Lab Orders  No laboratory test(s) ordered today   No images are attached to the encounter or orders placed in the encounter.  Return if symptoms worsen or fail to improve.   Salvatore Decent, FNP

## 2023-12-02 NOTE — Telephone Encounter (Signed)
 Pt was sent a text to call for NT. She has an appt at 10:20 and feels she is ok it is not an allergic reaction.

## 2023-12-18 ENCOUNTER — Other Ambulatory Visit: Payer: Self-pay

## 2023-12-18 ENCOUNTER — Ambulatory Visit
Admission: EM | Admit: 2023-12-18 | Discharge: 2023-12-18 | Disposition: A | Discharge status: Home / Self Care | Attending: Internal Medicine | Admitting: Internal Medicine

## 2023-12-18 ENCOUNTER — Ambulatory Visit: Payer: Self-pay

## 2023-12-18 DIAGNOSIS — T781XXA Other adverse food reactions, not elsewhere classified, initial encounter: Secondary | ICD-10-CM

## 2023-12-18 DIAGNOSIS — F411 Generalized anxiety disorder: Secondary | ICD-10-CM

## 2023-12-18 MED ORDER — FAMOTIDINE 20 MG PO TABS: 20.0000 mg | ORAL_TABLET | Freq: Two times a day (BID) | ORAL | 0 refills | Status: DC

## 2023-12-18 MED ORDER — DEXAMETHASONE SODIUM PHOSPHATE 10 MG/ML IJ SOLN
10.0000 mg | Freq: Once | INTRAMUSCULAR | Status: AC
  Administered 2023-12-18: 10 mg via INTRAMUSCULAR

## 2023-12-18 MED ORDER — LORATADINE 10 MG PO TABS: 10.0000 mg | ORAL_TABLET | Freq: Every day | ORAL | 0 refills | Status: DC

## 2023-12-18 NOTE — Telephone Encounter (Signed)
  Chief Complaint: allergic reaction Symptoms: tongue swelling, mouth blisters Frequency: x this afternoon Pertinent Negatives: Patient denies SOB, weakness. Disposition: [x] ED /[] Urgent Care (no appt availability in office) / [] Appointment(In office/virtual)/ []  [facility]/ [] Home Care/ [x] Refused Recommended Disposition /[] [facility]/ []  Follow-up with PCP Additional Notes: Patient very anxious in hysterics, speaking very rapidly and crying, not allowing RN to speak. She states she has blisters in her mouth and tongue swelling. She states she realized she ate kiwi earlier and has had an allergic reaction to it in the past. Patient refused ED. Called [facility] to advise of ED refusal and asked if they have any availability. They do not. Patient states: "I'm reading on the internet you should contact you doctor immediately". Then states she is just going to show up with out an appointment. Advised patient to not to do that and go to ED. Patient is requesting an Epi Pen as well.  Patient states she has taken a Benadryl.  . Reason for Disposition  [1] Widespread hives, itching or facial swelling AND [2] onset < 2 hours of exposure to high-risk allergen (e.g., sting, nuts, 1st dose of antibiotic)  Answer Assessment - Initial Assessment Questions 1. SYMPTOMS: "What is the most serious symptom?"     Tongue swelling and blisters in mouth.  2. AIRWAY: "Are they breathing?" (e.g., Yes, No)  (R/O: airway blockage, respiratory arrest)      Yes.  3. BREATHING: "Is there difficulty breathing?" (e.g., Yes, No; wheezing, unable to complete a sentence)  (R/O: respiratory distress)     No. Able to speak in full sentences.  4. CIRCULATION: "Are you feeling weak?"  (e.g., Yes, No, Unknown; severity)  (R/O: shock) If Yes, ask: "Can you stand and walk normally?"     No.  5. SWALLOWING: "Can you swallow?" (e.g.,Yes, No; food, fluid, saliva)      Yes.  6. ONSET: "When did the reaction start?"  (Minutes or hours ago)      Today, this afternoon.  7. SUBSTANCE: "What are you reacting to?" "When did the contact occur?"      Kiwi, ate it today.  8. PREVIOUS REACTION: "Have you ever reacted to it before?" If Yes, ask: "What happened that time?"     Yes, last time it was March 25th. She had canker sores and was given a prednisone shot. She states she had an episode of having difficulty breathing at night.  9. EPINEPHRINE: "Do you have an epinephrine autoinjector (e.g., EpiPen)?"     She does not have one, requesting one.  Protocols used: Anaphylaxis-A-AH

## 2023-12-18 NOTE — Discharge Instructions (Addendum)
 You have been evaluated today for an allergic reaction. We gave you a steroid shot to help with your symptoms.  Take famotidine every 12 hours for the next 10 days.  Take claritin antihistamine once nightly for the next 7 nights instead of benadryl. This will act similarly to benadryl.  Do not eat any more fruits until you have been evaluated by an allergy specialist/PCP.   If you start having throat swelling, difficulty breathing, or full body rash/hives, please go to the ER.  Schedule an appointment for follow-up with PCP in the next 4-5 days for re-check.

## 2023-12-18 NOTE — ED Triage Notes (Addendum)
 Pt states she ate kiwi this morning at bible study and now her mouth is swollen on left side with sores on left side of tongue. She is currently able to breathe and talk normally.   She tried to get in with PCP today but they could not see her. She had this problem back in March after eating fruit and states she began choking in the middle of the night and does not want that to happen again. She took benadryl at home. IS very anxious  and does not want to take abx

## 2023-12-18 NOTE — ED Provider Notes (Signed)
 [facility]    CSN: [id number] Arrival date & time: 12/18/23  1441      History   Chief Complaint Chief Complaint  Patient presents with   Allergic Reaction    HPI Karen Donovan is a 73 y.o. female.   Karen Donovan is a 73 y.o. female presenting for chief complaint of tongue and left cheek swelling that started a couple of hours after eating kiwi at bible study today. She ate the kiwi at around 9am and started feeling tongue swelling and left cheek swelling at 1pm 4 hours after consumption. She states similar symptoms have happened in the past after eating oranges and pineapple and have led to "throat swelling and choking overnight". She is very concerned that current symptoms will progress to this and wishes to "take care of it early".  She reports blister lesion to the left cheek with onset of tongue swelling and notes "bumps" to the left side of the tongue. She denies trouble swallowing, changes in voice sounds, chest pain, shortness of breath, wheezing, cough, rash/hives, and headache/dizziness.  She has taken benadryl with some relief prior to arrival at urgent care. She admits to extreme concern for worsening symptoms and requests a steroid today to treat suspected allergic reaction.  She was recently given steroid dosepak by primary care for similar concern and finished this last week. Called PCP requesting refill of this today who did not have any openings and recommended urgent care visit. She has never been worked up by an Proofreader for this.    Allergic Reaction   No past medical history on file.  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      Home Medications    Prior to  Admission medications   Medication Sig Start Date End Date Taking? Authorizing Provider  famotidine (PEPCID) 20 MG tablet Take 1 tablet (20 mg total) by mouth 2 (two) times daily for 10 days. 12/18/23 12/28/23 Yes Ivis Nicolson, Donavan Burnet, FNP  loratadine (CLARITIN) 10 MG tablet Take 1 tablet (10 mg total) by mouth at bedtime for 7 days. 12/18/23 12/25/23 Yes Carlisle Beers, FNP  Biotin 10 MG CAPS Take by mouth.    [provider]  methylPREDNISolone (MEDROL DOSEPAK) 4 MG TBPK tablet Use as directed. Patient not taking: Reported on 12/18/2023 11/26/23   Salvatore Decent, FNP  Multiple Vitamin (MULTIVITAMIN) tablet Take 1 tablet by mouth daily.    [provider]    Family History Family History  Problem Relation Age of Onset   Heart disease Mother    Emphysema Father     Social History Social History   Tobacco Use   Smoking status: Never   Smokeless tobacco: Never  Vaping Use   Vaping status: Never Used  Substance Use Topics   Alcohol use: Never   Drug use: Never     Allergies   Patient has no known allergies.   Review of Systems Review of Systems Per HPI  Physical Exam Triage Vital Signs ED Triage Vitals  Encounter Vitals Group     BP 12/18/23 1500 (!) 175/74     Systolic BP Percentile --  Diastolic BP Percentile --      Pulse Rate 12/18/23 1447 69     Resp 12/18/23 1447 18     Temp 12/18/23 1447 98 F (36.7 C)     Temp Source 12/18/23 1447 Oral     SpO2 12/18/23 1447 98 %     Weight --      Height --      Head Circumference --      Peak Flow --      Pain Score 12/18/23 1456 4     Pain Loc --      Pain Education --      Exclude from Growth Chart --    No data found.  Updated Vital Signs BP (!) 175/74 (BP Location: Right Arm)   Pulse 69   Temp 98 F (36.7 C) (Oral)   Resp 18   SpO2 98%   Visual Acuity Right Eye Distance:   Left Eye Distance:   Bilateral Distance:    Right Eye Near:   Left Eye Near:    Bilateral Near:      Physical Exam Vitals and nursing note reviewed.  Constitutional:      Appearance: She is not ill-appearing or toxic-appearing.  HENT:     Head: Normocephalic and atraumatic.     Right Ear: Hearing, tympanic membrane, ear canal and external ear normal.     Left Ear: Hearing, tympanic membrane, ear canal and external ear normal.     Nose: Nose normal.     Mouth/Throat:     Lips: Pink.     Mouth: Mucous membranes are moist. No injury or oral lesions.     Dentition: Normal dentition.     Tongue: Lesions (flesh colored pinpoint papular lesions to both sides of tongue) present.     Pharynx: Oropharynx is clear. Uvula midline. No pharyngeal swelling, oropharyngeal exudate, posterior oropharyngeal erythema, uvula swelling or postnasal drip.     Tonsils: No tonsillar exudate.     Comments: No trismus, phonation normal, maintaining secretions without difficulty. No buccal lesions observed on exam, no palpable buccal lesions bilaterally. Tongue is normal, no observable swelling.  Eyes:     General: Lids are normal. Vision grossly intact. Gaze aligned appropriately.     Extraocular Movements: Extraocular movements intact.     Conjunctiva/sclera: Conjunctivae normal.  Neck:     Trachea: Trachea and phonation normal.  Pulmonary:     Effort: Pulmonary effort is normal.  Musculoskeletal:     Cervical back: Neck supple.  Lymphadenopathy:     Cervical: No cervical adenopathy.  Skin:    General: Skin is warm and dry.     Capillary Refill: Capillary refill takes less than 2 seconds.     Findings: No rash.  Neurological:     General: No focal deficit present.     Mental Status: She is alert and oriented to person, place, and time. Mental status is at baseline.     Cranial Nerves: No dysarthria or facial asymmetry.  Psychiatric:        Attention and Perception: Attention normal.        Mood and Affect: Mood is anxious.        Speech: Speech normal.        Behavior: Behavior normal.         Thought Content: Thought content normal.        Judgment: Judgment normal.      UC Treatments / Results  Labs (all labs ordered are  listed, but only abnormal results are displayed) Labs Reviewed - No data to display  EKG   Radiology No results found.  Procedures Procedures (including critical care time)  Medications Ordered in UC Medications  dexamethasone (DECADRON) injection 10 mg (10 mg Intramuscular Given 12/18/23 1532)    Initial Impression / Assessment and Plan / UC Course  I have reviewed the triage vital signs and the nursing notes.  Pertinent labs & imaging results that were available during my care of the patient were reviewed by me and considered in my medical decision making (see chart for details).   1. Allergic reaction to food, anxiety state No tongue swelling or signs of anaphylaxis on exam. Tongue irritation is likely due to acidity of kiwi fruit rather than allergic reaction process; however, given patient's significant concern, will treat potential allergic reaction pathway with injection of dexamethasone 10mg  IM. Pepcid BID for 10 days prescribed. She may take Claritin once daily instead of benadryl as this is non-drowsy and safer for her. HEENT exam is stable, lungs clear.  Recommend follow-up with PCP and allergy specialist. Advised to avoid eating fruits until evaluated further by allergist due to concern for allergic reactions.   Counseled patient on potential for adverse effects with medications prescribed/recommended today, strict ER and return-to-clinic precautions discussed, patient verbalized understanding.    Final Clinical Impressions(s) / UC Diagnoses   Final diagnoses:  Allergic reaction to food, initial encounter  Anxiety state     Discharge Instructions      You have been evaluated today for an allergic reaction. We gave you a steroid shot to help with your symptoms.  Take famotidine every 12 hours for the next 10 days.  Take  claritin antihistamine once nightly for the next 7 nights instead of benadryl. This will act similarly to benadryl.  Do not eat any more fruits until you have been evaluated by an allergy specialist/PCP.   If you start having throat swelling, difficulty breathing, or full body rash/hives, please go to the ER.  Schedule an appointment for follow-up with PCP in the next 4-5 days for re-check.     ED Prescriptions     Medication Sig Dispense Auth. Provider   famotidine (PEPCID) 20 MG tablet Take 1 tablet (20 mg total) by mouth 2 (two) times daily for 10 days. 20 tablet Padme Arriaga M, FNP   loratadine (CLARITIN) 10 MG tablet Take 1 tablet (10 mg total) by mouth at bedtime for 7 days. 7 tablet Starlene Eaton, FNP      PDMP not reviewed this encounter.   Starlene Eaton, [place] 12/18/23 Darnell Elbe

## 2023-12-18 NOTE — Telephone Encounter (Signed)
 Pt showed up to the office reporting that she believed she was having an allergic rxn to [facility] that she ate earlier. Advised that we did not have any appointments available and she should go to urgent care to be seen. Pt states she just wants Gavin Kast, NP to send her some more steroids to the pharmacy. I advised that she would need to be seen and the provider cannot just send medication in without assessing her. Advised her of the [facility]. Pt stated she didn't want to go, hopefully it wouldn't be that bad, she would just drink a lot of water, and she will pray about it. Pt turned away and exited the building.

## 2024-01-28 ENCOUNTER — Telehealth: Payer: Self-pay | Admitting: Family Medicine

## 2024-01-28 NOTE — Telephone Encounter (Signed)
 Copied from CRM [id number]. Topic: General - Billing Inquiry >> Jan 28, 2024 10:47 AM Lajean Pike wrote: Reason for CRM: Patient called in stating that Medicare did not approve her March 31st appointment with Dr. Aisha Ali. Medicare stated to patient that the office needs to resubmit documentation stating a medically necessary reason for the visit, the amoxicillin , and that it was in regards to the an allergic reaction she had. Patient stated that she did also contact [facility] to assist and she was advised to reach the office. Patient may be reached at [phone].  Pt was seen by Gavin Kast for this visit.

## 2024-02-03 NOTE — Telephone Encounter (Signed)
 Karen Donovan has been written off. Medicare did not cover due to being a dental diagnosis. Please advise pt no payment was directed to her.

## 2024-02-03 NOTE — Telephone Encounter (Signed)
 Msg sent to billing mgr

## 2024-02-05 ENCOUNTER — Encounter: Admitting: Family Medicine

## 2024-02-24 ENCOUNTER — Ambulatory Visit: Payer: Self-pay | Admitting: Family Medicine

## 2024-02-24 ENCOUNTER — Ambulatory Visit (INDEPENDENT_AMBULATORY_CARE_PROVIDER_SITE_OTHER): Admitting: Family Medicine

## 2024-02-24 ENCOUNTER — Encounter: Payer: Self-pay | Admitting: Family Medicine

## 2024-02-24 VITALS — BP 124/72 | HR 63 | Temp 97.4°F | Ht 62.0 in | Wt 111.8 lb

## 2024-02-24 DIAGNOSIS — Q632 Ectopic kidney: Secondary | ICD-10-CM | POA: Diagnosis not present

## 2024-02-24 DIAGNOSIS — Z0000 Encounter for general adult medical examination without abnormal findings: Secondary | ICD-10-CM | POA: Diagnosis not present

## 2024-02-24 LAB — BASIC METABOLIC PANEL WITH GFR
BUN: 18 mg/dL (ref 6–23)
CO2: 28 meq/L (ref 19–32)
Calcium: 9.4 mg/dL (ref 8.4–10.5)
Chloride: 100 meq/L (ref 96–112)
Creatinine, Ser: 0.57 mg/dL (ref 0.40–1.20)
GFR: 90.49 mL/min (ref >60.00)
Glucose, Bld: 83 mg/dL (ref 70–99)
Potassium: 3.8 meq/L (ref 3.5–5.1)
Sodium: 135 meq/L (ref 135–145)

## 2024-02-24 NOTE — Assessment & Plan Note (Signed)
 Overall health is excellent. We discussed the following recommended screenings and immunizations: Recommend she seek tetanus immunization with her pharmacy Recommend colonoscopy of Cologuard for colon cancer screening- Patient declines Recommend she had a bone density test for osteoporosis screening- Patient declines Recommend mammogram for breast cancer screening- Patient declines.

## 2024-02-24 NOTE — Patient Instructions (Addendum)
  Karen Donovan ,  Thank you for taking time to come for your Medicare Wellness Visit. I appreciate your ongoing commitment to your health goals. Please review the following plan we discussed and let me know if I can assist you in the future.   This is a list of the screening recommended for you and due dates:  Health Maintenance  Topic Date Due   Hepatitis C Screening  Never done   DTaP/Tdap/Td vaccine (1 - Tdap) Never done   Colon Cancer Screening  Never done   Mammogram  Never done   DEXA scan (bone density measurement)  Never done   COVID-19 Vaccine (3 - 2024-25 season) 03/11/2024*   Flu Shot  04/03/2024   Medicare Annual Wellness Visit  02/23/2025   Pneumococcal Vaccine for age over 53  Completed   Zoster (Shingles) Vaccine  Completed   HPV Vaccine  Aged Out   Meningitis B Vaccine  Aged Out  *Topic was postponed. The date shown is not the original due date.

## 2024-02-24 NOTE — Progress Notes (Signed)
 [facility] [address] [place] [address] Dept: [phone] Dept Fax: [phone]  Medicare Annual Wellness Visit Visit  Subjective:    Patient ID: Karen Donovan, female    DOB: 1951-03-19, 73 y.o..   MRN: [id number]  Chief Complaint  Patient presents with   Annual Exam    CPE/labs.     History of Present Illness:  Patient is in today for a Medicare annual wellness visit.  Reviewed health risk assessment. Patient feels current health is excellent. She remains independent and is able to manage all of her ADLs and IADLs. She feels she has adequate housing, transportation, and resources.   Reviewed and updated past medical, surgical and family history. No new allergies.  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     Multiple Vitamin (MULTIVITAMIN) tablet Take 1 tablet by mouth daily.     famotidine  (PEPCID ) 20 MG tablet Take 1 tablet (20 mg total) by mouth 2 (two) times daily for 10 days. 20 tablet 0   loratadine  (CLARITIN ) 10 MG tablet Take 1 tablet (10 mg total) by mouth at bedtime for 7 days. 7 tablet 0   methylPREDNISolone  (MEDROL  DOSEPAK) 4 MG TBPK tablet Use as directed. (Patient not taking: Reported on 12/18/2023) 21 each 0   No facility-administered medications prior to visit.   No Known Allergies Objective:   Today's Vitals   02/24/24 0849  BP: 124/72  Pulse: 63  Temp: (!) 97.4 F (36.3 C)  TempSrc: Temporal  SpO2: 99%  Weight: 111 lb 12.8 oz (50.7 kg)   Height: 5' 2 (1.575 m)   Body mass index is 20.45 kg/m.   General: Well developed, well nourished. No acute distress. HEENT: Normocephalic, non-traumatic. PERRL, EOMI. Conjunctiva clear. External ears normal. EAC and TMs   normal bilaterally. Nose clear without congestion or rhinorrhea. Mucous membranes moist. Oropharynx clear.   Good dentition. Neck: Supple. No lymphadenopathy. No thyromegaly. Lungs: Clear to auscultation bilaterally. No wheezing, rales or rhonchi. CV: RRR without murmurs or rubs. Pulses 2+ bilaterally. Abdomen: Soft, non-tender. Bowel sounds positive, normal pitch and frequency. No hepatosplenomegaly. No   rebound or guarding. Extremities: Full ROM. No joint swelling or tenderness. No edema noted. Psych: Alert and oriented. Normal mood and affect. No sing of cognitive decline  Health Maintenance Due  Topic Date Due   Hepatitis C Screening  Never done   DTaP/Tdap/Td (1 - Tdap) Never done   Colonoscopy  Never done   MAMMOGRAM  Never done   DEXA SCAN  Never done     Flowsheet Row Office Visit from 02/24/2024 in [facility] Total Score 0      08/30/2022    8:42 AM 12/24/2022   10:03 AM 11/26/2023    3:48 PM 12/02/2023   10:18 AM 02/24/2024    8:56 AM  Fall Risk  Falls in the past year? 0 0 0 0 0  Was  there an injury with Fall?  0 0 0 0  Fall Risk Category Calculator  0 0 0 0  Patient at Risk for Falls Due to  No Fall Risks No Fall Risks No Fall Risks No Fall Risks  Fall risk Follow up  Falls evaluation completed Falls evaluation completed Falls prevention discussed Falls evaluation completed   Assessment & Plan:   Problem List Items Addressed This Visit       Genitourinary   Pelvic kidney   I will check annual renal functions.      Relevant Orders   Basic metabolic panel with GFR     Other   Medicare annual wellness visit, subsequent - Primary   Overall health is excellent. We discussed the following  recommended screenings and immunizations: Recommend she seek tetanus immunization with her pharmacy Recommend colonoscopy of Cologuard for colon cancer screening- Patient declines Recommend she had a bone density test for osteoporosis screening- Patient declines Recommend mammogram for breast cancer screening- Patient declines.        Return in about 1 year (around 02/23/2025) for Annual preventative care.   Garnette CHRISTELLA Simpler, MD

## 2024-02-24 NOTE — Assessment & Plan Note (Signed)
 I will check annual renal functions.

## 2025-02-24 ENCOUNTER — Ambulatory Visit: Admitting: Family Medicine
